# Patient Record
Sex: Male | Born: 1980 | Race: Black or African American | Hispanic: No | Smoking: Current every day smoker
Health system: Southern US, Community
[De-identification: ages and names within clinical notes are randomized; demographics above are authoritative.]

---

## 2005-10-06 ENCOUNTER — Emergency Department (HOSPITAL_COMMUNITY): Admission: EM | Admit: 2005-10-06 | Discharge: 2005-10-07 | Payer: Self-pay | Admitting: Emergency Medicine

## 2008-05-04 ENCOUNTER — Emergency Department (HOSPITAL_COMMUNITY): Admission: EM | Admit: 2008-05-04 | Discharge: 2008-05-04 | Payer: Self-pay | Admitting: Emergency Medicine

## 2009-04-03 ENCOUNTER — Emergency Department (HOSPITAL_COMMUNITY): Admission: EM | Admit: 2009-04-03 | Discharge: 2009-04-03 | Payer: Self-pay | Admitting: Emergency Medicine

## 2009-08-12 ENCOUNTER — Emergency Department (HOSPITAL_COMMUNITY): Admission: EM | Admit: 2009-08-12 | Discharge: 2009-08-12 | Payer: Self-pay | Admitting: Emergency Medicine

## 2010-07-31 LAB — CULTURE, ROUTINE-ABSCESS

## 2010-08-13 LAB — GC/CHLAMYDIA PROBE AMP, GENITAL: GC Probe Amp, Genital: NEGATIVE

## 2012-07-02 ENCOUNTER — Emergency Department (HOSPITAL_COMMUNITY)
Admission: EM | Admit: 2012-07-02 | Discharge: 2012-07-02 | Disposition: A | Discharge status: Home / Self Care | Payer: Self-pay | Attending: Emergency Medicine | Admitting: Emergency Medicine

## 2012-07-02 ENCOUNTER — Encounter (HOSPITAL_COMMUNITY): Payer: Self-pay | Admitting: Emergency Medicine

## 2012-07-02 DIAGNOSIS — L0231 Cutaneous abscess of buttock: Secondary | ICD-10-CM | POA: Insufficient documentation

## 2012-07-02 DIAGNOSIS — F17200 Nicotine dependence, unspecified, uncomplicated: Secondary | ICD-10-CM | POA: Insufficient documentation

## 2012-07-02 LAB — URINALYSIS, ROUTINE W REFLEX MICROSCOPIC
Bilirubin Urine: NEGATIVE
Glucose, UA: NEGATIVE mg/dL
Protein, ur: NEGATIVE mg/dL
pH: 6.5 (ref 5.0–8.0)

## 2012-07-02 LAB — CBC WITH DIFFERENTIAL/PLATELET
Basophils Relative: 0 % (ref 0–1)
Eosinophils Absolute: 0 10*3/uL (ref 0.0–0.7)
Eosinophils Relative: 0 % (ref 0–5)
Lymphs Abs: 1.9 10*3/uL (ref 0.7–4.0)
MCH: 30.8 pg (ref 26.0–34.0)
MCHC: 34.7 g/dL (ref 30.0–36.0)
MCV: 88.9 fL (ref 78.0–100.0)
Monocytes Relative: 7 % (ref 3–12)
Neutro Abs: 9.2 10*3/uL — ABNORMAL HIGH (ref 1.7–7.7)
Platelets: 171 10*3/uL (ref 150–400)
WBC: 11.9 10*3/uL — ABNORMAL HIGH (ref 4.0–10.5)

## 2012-07-02 LAB — COMPREHENSIVE METABOLIC PANEL
AST: 29 U/L (ref 0–37)
Alkaline Phosphatase: 72 U/L (ref 39–117)
Chloride: 102 mEq/L (ref 96–112)
GFR calc Af Amer: >90 mL/min (ref >90)
Glucose, Bld: 75 mg/dL (ref 70–99)
Sodium: 143 mEq/L (ref 135–145)
Total Bilirubin: 0.6 mg/dL (ref 0.3–1.2)

## 2012-07-02 MED ORDER — HYDROMORPHONE HCL PF 1 MG/ML IJ SOLN
1.0000 mg | Freq: Once | INTRAMUSCULAR | Status: AC
  Administered 2012-07-02: 1 mg via INTRAVENOUS
  Filled 2012-07-02: qty 1

## 2012-07-02 MED ORDER — ACETAMINOPHEN 325 MG PO TABS
650.0000 mg | ORAL_TABLET | Freq: Once | ORAL | Status: AC
  Administered 2012-07-02: 650 mg via ORAL
  Filled 2012-07-02: qty 2

## 2012-07-02 MED ORDER — HYDROCODONE-ACETAMINOPHEN 5-325 MG PO TABS: 2.0000 | ORAL_TABLET | ORAL | Status: AC | PRN

## 2012-07-02 MED ORDER — SODIUM CHLORIDE 0.9 % IV SOLN
1000.0000 mL | Freq: Once | INTRAVENOUS | Status: AC
  Administered 2012-07-02: 1000 mL via INTRAVENOUS

## 2012-07-02 MED ORDER — ONDANSETRON HCL 4 MG/2ML IJ SOLN
4.0000 mg | Freq: Once | INTRAMUSCULAR | Status: AC
  Administered 2012-07-02: 4 mg via INTRAVENOUS
  Filled 2012-07-02: qty 2

## 2012-07-02 NOTE — ED Provider Notes (Signed)
History     CSN: [id number]  Arrival date & time 07/02/12  1538   First MD Initiated Contact with Patient 07/02/12 1956      Chief Complaint  Patient presents with  . Rash    (Consider location/radiation/quality/duration/timing/severity/associated sxs/prior treatment) HPI Comments: 3 days ago.  Patient noticed discomfort in his gluteal area.  In the gluteal cleft.  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  At this time.  It is draining her, you wouldn't material is painful to sit.  Denies any fever.  Patient is a 32 y.o. male presenting with rash. The history is provided by the patient.  Rash Location:  Ano-genital Quality: blistering and draining   Severity:  Moderate Duration:  3 days Timing:  Constant Progression:  Worsening Chronicity:  New Associated symptoms: no diarrhea and no fever     History reviewed. No pertinent past medical history.  History reviewed. No pertinent past surgical history.  No family history on file.  History  Substance Use Topics  . Smoking status: Current Every Day Smoker  . Smokeless tobacco: Not on file  . Alcohol Use: Yes      Review of Systems  Constitutional: Negative for fever and chills.  Gastrointestinal: Negative for diarrhea, constipation and rectal pain.  Genitourinary: Negative for dysuria.  Skin: Positive for wound. Negative for rash.  All other systems reviewed and are negative.    Allergies  Review of patient's allergies indicates no known allergies.  Home Medications   Current Outpatient Rx  Name  Route  Sig  Dispense  Refill  . Acetaminophen (TYLENOL PO)   Oral   Take 2 tablets by mouth every 6 (six) hours as needed (pain).         Marland Kitchen HYDROcodone-acetaminophen (NORCO/VICODIN) 5-325 MG per tablet   Oral   Take 2 tablets by mouth every 4 (four) hours as needed for pain.   10 tablet   0     BP 103/63  Pulse 88  Temp(Src) 99.9 F (37.7  C) (Oral)  Resp 20  SpO2 100%  Physical Exam  Nursing note and vitals reviewed. Constitutional: He appears well-developed and well-nourished.  HENT:  Head: Normocephalic.  Eyes: Pupils are equal, round, and reactive to light.  Neck: Normal range of motion.  Cardiovascular: Normal rate.   Abdominal: Soft.  Genitourinary: Rectum normal.     Neurological: He is alert.  Skin: Skin is warm. No erythema.    ED Course  INCISION AND DRAINAGE Date/Time: 07/02/2012 9:18 PM Performed by: Arman Filter Authorized by: Arman Filter Consent: Verbal consent obtained. Risks and benefits: risks, benefits and alternatives were discussed Consent given by: patient Patient understanding: patient states understanding of the procedure being performed Patient identity confirmed: verbally with patient Time out: Immediately prior to procedure a "time out" was called to verify the correct patient, procedure, equipment, support staff and site/side marked as required. Type: abscess Body area: anogenital Anesthesia: local infiltration Local anesthetic: lidocaine 1% with epinephrine Anesthetic total: 2 ml Patient sedated: no Scalpel size: 11 Needle gauge: 22 Incision type: single straight Complexity: simple Drainage: purulent Drainage amount: copious Packing material: 1/4 in iodoform gauze Patient tolerance: Patient tolerated the procedure well with no immediate complications.   (including critical care time)  Labs Reviewed  CBC WITH DIFFERENTIAL - Abnormal; Notable for the following:    WBC 11.9 (*)    Neutro Abs 9.2 (*)  All other components within normal limits  COMPREHENSIVE METABOLIC PANEL  URINALYSIS, ROUTINE W REFLEX MICROSCOPIC   No results found.   1. Abscess of buttock, right       MDM   Patient has a draining abscess in the right gluteal fold approximately 1 cm x 1 cm, with multiple punctate openings.  No surrounding erythema or cellulitis.  I&D was performed for  copious amount of pertinent fluid 1/2 inch packing placed to allow continuous drainage.  Over the next 2 days.  Patient was instructed to remove this in 2 days.  He may shower, but not bathe, he'll be sent home with pain medication, and instructions for        Arman Filter, NP 07/02/12 2121

## 2012-07-02 NOTE — ED Provider Notes (Signed)
Medical screening examination/treatment/procedure(s) were performed by non-physician practitioner and as supervising physician I was immediately available for consultation/collaboration.   Loren Racer, MD 07/02/12 2248

## 2012-07-02 NOTE — ED Notes (Signed)
Pt c/o painful rash above rectum, onset 3 days ago.  Pt st's his glands are also swollen.

## 2012-07-02 NOTE — ED Notes (Addendum)
Verified temp of 100.0 orally with Kathrine Cords, NP; Dondra Spry, NP states pt stable for d/c, but can put in for 650 mg of tylenol before d/c.

## 2012-07-02 NOTE — ED Notes (Addendum)
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 States that he thinks the other hole has become infected. Yellow exudate present when pulling buttocks apart. Pt states pain is 7/10 with movement. Denies nausea, vomiting with the pain. R/O pilonidal cyst.

## 2012-07-02 NOTE — ED Notes (Signed)
Pt ambulatory leaving ED; pt alert and mentating appropriately upon d/c. Pt given d/c teaching and prescriptions. Pt has been instructed not to drive and endorses that he is not driving and has a friend coming to pick him up from ED; pt has no further questions upon d/c. Pt does not show signs of acute distress upon d/c.

## 2012-07-02 NOTE — ED Notes (Signed)
Gave pt mesh underwear to wear per EDP instruction.

## 2012-07-02 NOTE — ED Notes (Signed)
Pt attempting to call for ride home

## 2014-04-25 ENCOUNTER — Emergency Department (HOSPITAL_COMMUNITY)
Admission: EM | Admit: 2014-04-25 | Discharge: 2014-04-26 | Disposition: A | Discharge status: Home / Self Care | Payer: Self-pay | Attending: Emergency Medicine | Admitting: Emergency Medicine

## 2014-04-25 ENCOUNTER — Emergency Department (HOSPITAL_COMMUNITY): Payer: Self-pay

## 2014-04-25 ENCOUNTER — Encounter (HOSPITAL_COMMUNITY): Payer: Self-pay | Admitting: Emergency Medicine

## 2014-04-25 DIAGNOSIS — L03012 Cellulitis of left finger: Secondary | ICD-10-CM | POA: Insufficient documentation

## 2014-04-25 DIAGNOSIS — Z720 Tobacco use: Secondary | ICD-10-CM | POA: Insufficient documentation

## 2014-04-25 NOTE — ED Notes (Signed)
The patient said he woke up and his pinky finger on his left hand is red and swollen.   He denies injury.  His finger is red, swollen and he rates his pain 6/10.

## 2014-04-25 NOTE — ED Provider Notes (Signed)
CSN: [id number]     Arrival date & time 04/25/14  2219 History  This chart was scribed for Dorthula Matasiffany G Yaretsi Humphres, PA-C, working with Donnetta HutchingBrian Cook, MD by Elon SpannerGarrett Cook, ED Scribe. This patient was seen in room TR10C/TR10C and the patient's care was started at 11:46 PM.   Chief Complaint  Patient presents with  . Finger Injury    The patient said he woke up and his pinky finger on his left hand is red and swollen.   The history is provided by the patient. No language interpreter was used.   HPI Comments: Oscar LitterGino Desir is a 33 y.o. male who presents to the Emergency Department complaining of pain with associated swelling on his left hand ring finger onset this morning.  Patient reports touch aggravates the pain.  Patient does not recall injury or puncture. He can move his finger without significant pain but it hurts to touch.  NKA. No fevers,  History reviewed. No pertinent past medical history. History reviewed. No pertinent past surgical history. History reviewed. No pertinent family history. History  Substance Use Topics  . Smoking status: Current Every Day Smoker  . Smokeless tobacco: Not on file  . Alcohol Use: Yes    Review of Systems  Musculoskeletal: Positive for joint swelling.  All other systems reviewed and are negative.   Allergies  Review of patient's allergies indicates no known allergies.  Home Medications   Prior to Admission medications   Medication Sig Start Date End Date Taking? Authorizing Provider  Acetaminophen (TYLENOL PO) Take 2 tablets by mouth every 6 (six) hours as needed (pain).   Yes Historical Provider, MD  cephALEXin (KEFLEX) 500 MG capsule Take 1 capsule (500 mg total) by mouth 4 (four) times daily. 04/26/14   Destan Franchini Irine SealG Abigaile Rossie, PA-C  HYDROcodone-acetaminophen (NORCO/VICODIN) 5-325 MG per tablet Take 2 tablets by mouth every 4 (four) hours as needed for pain. Patient not taking: Reported on 04/25/2014 07/02/12   Arman FilterGail K Schulz, NP  traMADol (ULTRAM) 50 MG tablet  Take 1 tablet (50 mg total) by mouth every 6 (six) hours as needed. 04/26/14   Lashina Milles Irine SealG Zane Samson, PA-C   BP 114/73 mmHg  Pulse 75  Temp(Src) 97.5 F (36.4 C) (Oral)  Resp 20  SpO2 100% Physical Exam  Constitutional: He is oriented to person, place, and time. He appears well-developed and well-nourished. No distress.  HENT:  Head: Normocephalic and atraumatic.  Eyes: Conjunctivae and EOM are normal.  Neck: Neck supple. No tracheal deviation present.  Cardiovascular: Normal rate.   Pulmonary/Chest: Effort normal. No respiratory distress.  Musculoskeletal: Normal range of motion.       Hands: The skin is warm and moist. There is no wound or lesions. No ecchymosis, crepitus. CR < 2 seconds. No significant swelling at this time or sausage digit  Neurological: He is alert and oriented to person, place, and time.  Skin: Skin is warm and dry.  Psychiatric: He has a normal mood and affect. His behavior is normal.  Nursing note and vitals reviewed.   ED Course  Procedures (including critical care time)  DIAGNOSTIC STUDIES: Oxygen Saturation is 100% on RA, normal by my interpretation.    COORDINATION OF CARE:  11:48 PM Informed patient of infection.  Patients finger shows cellulitis but no paroncyhia, falon, or tenosynovitis at this time. He is at risk for either of this but it is too early to tell for sure. Hopefully the abx will prevent either of these. Will prescribe pain medication, antibiotics  and provide first does in ED.  Advised patient to return to ED for follow-up in 24-48 hours.  Advised patient of return precautions including sever pain, increased swelling, red streaking.  Patient acknowledges and agrees with plan.    Labs Review Labs Reviewed - No data to display  Imaging Review Dg Finger Little Left  04/26/2014   CLINICAL DATA:  Finger pain  EXAM: LEFT LITTLE FINGER 2+V  COMPARISON:  None.  FINDINGS: No fracture or dislocation is seen.  The joint spaces are preserved.  The  visualized soft tissues are unremarkable.  IMPRESSION: No fracture or dislocation is seen.   Electronically Signed   By: Charline BillsSriyesh  Krishnan M.D.   On: 04/26/2014 00:06     EKG Interpretation None      MDM   Final diagnoses:  Cellulitis of finger of left hand    33 y.o.Oscar Reynolds's evaluation in the Emergency Department is complete. It has been determined that no acute conditions requiring further emergency intervention are present at this time. The patient/guardian have been advised of the diagnosis and plan. We have discussed signs and symptoms that warrant return to the ED, such as changes or worsening in symptoms.  Vital signs are stable at discharge. Filed Vitals:   04/26/14 0017  BP: 114/73  Pulse: 75  Temp: 97.5 F (36.4 C)  Resp: 20    Patient/guardian has voiced understanding and agreed to follow-up with the PCP or specialist.  I personally performed the services described in this documentation, which was scribed in my presence. The recorded information has been reviewed and is accurate.    Dorthula Matasiffany G Mayleen Borrero, PA-C 04/26/14 [id number]  Gwyneth SproutWhitney Plunkett, MD 04/27/14 [phone]

## 2014-04-26 ENCOUNTER — Encounter (HOSPITAL_COMMUNITY): Payer: Self-pay | Admitting: Emergency Medicine

## 2014-04-26 ENCOUNTER — Emergency Department (HOSPITAL_COMMUNITY)
Admission: EM | Admit: 2014-04-26 | Discharge: 2014-04-26 | Disposition: A | Discharge status: Home / Self Care | Payer: Self-pay | Attending: Emergency Medicine | Admitting: Emergency Medicine

## 2014-04-26 DIAGNOSIS — L03012 Cellulitis of left finger: Secondary | ICD-10-CM | POA: Insufficient documentation

## 2014-04-26 DIAGNOSIS — Z720 Tobacco use: Secondary | ICD-10-CM | POA: Insufficient documentation

## 2014-04-26 MED ORDER — CEPHALEXIN 500 MG PO CAPS: 500.0000 mg | ORAL_CAPSULE | Freq: Four times a day (QID) | ORAL | Status: AC

## 2014-04-26 MED ORDER — CEPHALEXIN 250 MG PO CAPS
500.0000 mg | ORAL_CAPSULE | Freq: Once | ORAL | Status: AC
  Administered 2014-04-26: 500 mg via ORAL
  Filled 2014-04-26: qty 2

## 2014-04-26 MED ORDER — TRAMADOL HCL 50 MG PO TABS: 50.0000 mg | ORAL_TABLET | Freq: Four times a day (QID) | ORAL | Status: AC | PRN

## 2014-04-26 MED ORDER — TRAMADOL HCL 50 MG PO TABS
50.0000 mg | ORAL_TABLET | Freq: Once | ORAL | Status: AC
  Administered 2014-04-26: 50 mg via ORAL
  Filled 2014-04-26: qty 1

## 2014-04-26 NOTE — ED Notes (Signed)
Patient here for wound check of left 5th finger. States that he was seen here for the same yesterday and was asked to return for recheck today. Patient started ABX as prescribed.

## 2014-04-26 NOTE — ED Provider Notes (Signed)
CSN: [id number]     Arrival date & time 04/26/14  [id number] History  This chart was scribed for non-physician practitioner, Trixie DredgeEmily Azzure Garabedian, PA-C, working with Loren Raceravid Yelverton, MD, by Bronson CurbJacqueline Melvin, ED Scribe. This patient was seen in room TR04C/TR04C and the patient's care was started at 8:25 PM.   Chief Complaint  Patient presents with  . Wound Check    The history is provided by the patient. No language interpreter was used.     HPI Comments: Oscar LitterGino Reynolds is a 33 y.o. male, with no significant medical history, who presents to the Emergency Department for a wound check. Patient states he was seen yesterday and diagnosed with cellulitis to his left 5th finger.  He was prescribed Tramadol and Kelfex and informed to return today for a follow up. There is associated mild tenderness to the left 5th finger and patient states his finger feels "tight". However, he states his symptoms have improved and he is now able to move the finger. He reports he has been taking ibuprofen with relief. He denies fever, chills, nausea, vomiting, diarrhea. Patient is right hand dominant and is not established with a PCP.  History reviewed. No pertinent past medical history. History reviewed. No pertinent past surgical history. History reviewed. No pertinent family history. History  Substance Use Topics  . Smoking status: Current Every Day Smoker -- 0.25 packs/day    Types: Cigarettes  . Smokeless tobacco: Not on file  . Alcohol Use: Yes    Review of Systems  Constitutional: Negative for fever and chills.  Gastrointestinal: Negative for nausea, vomiting and abdominal pain.  Musculoskeletal: Positive for myalgias.  Skin: Positive for color change.  Allergic/Immunologic: Negative for immunocompromised state.  Neurological: Negative for weakness and numbness.  Psychiatric/Behavioral: Negative for self-injury.      Allergies  Review of patient's allergies indicates no known allergies.  Home Medications    Prior to Admission medications   Medication Sig Start Date End Date Taking? Authorizing Provider  Acetaminophen (TYLENOL PO) Take 2 tablets by mouth every 6 (six) hours as needed (pain).    Historical Provider, MD  cephALEXin (KEFLEX) 500 MG capsule Take 1 capsule (500 mg total) by mouth 4 (four) times daily. 04/26/14   Tiffany Irine SealG Greene, PA-C  HYDROcodone-acetaminophen (NORCO/VICODIN) 5-325 MG per tablet Take 2 tablets by mouth every 4 (four) hours as needed for pain. Patient not taking: Reported on 04/25/2014 07/02/12   Arman FilterGail K Schulz, NP  traMADol (ULTRAM) 50 MG tablet Take 1 tablet (50 mg total) by mouth every 6 (six) hours as needed. 04/26/14   Dorthula Matasiffany G Greene, PA-C   Triage Vitals: BP 119/65 mmHg  Pulse 86  Temp(Src) 98.5 F (36.9 C) (Oral)  Resp 18  Wt 135 lb (61.236 kg)  SpO2 96%  Physical Exam  Constitutional: He appears well-developed and well-nourished. No distress.  HENT:  Head: Normocephalic and atraumatic.  Neck: Neck supple.  Pulmonary/Chest: Effort normal.  Musculoskeletal:  Left 5th finger with erythema and warmth over dorsal aspect. Sensation intact. No drainage, induration, or fluctuance. Full active ROM of left 5th finger. Capillary refill <2. No streaking proximal to finger.  Neurological: He is alert.  Skin: Skin is warm. He is not diaphoretic. There is erythema.  Nursing note and vitals reviewed.   ED Course  Procedures (including critical care time)  DIAGNOSTIC STUDIES: Oxygen Saturation is 96% on room air, adequate by my interpretation.    COORDINATION OF CARE: At 2030 Discussed treatment plan with patient. Patient agrees.  Labs Review Labs Reviewed - No data to display  Imaging Review Dg Finger Little Left  04/26/2014   CLINICAL DATA:  Finger pain  EXAM: LEFT LITTLE FINGER 2+V  COMPARISON:  None.  FINDINGS: No fracture or dislocation is seen.  The joint spaces are preserved.  The visualized soft tissues are unremarkable.  IMPRESSION: No fracture  or dislocation is seen.   Electronically Signed   By: Charline BillsSriyesh  Krishnan M.D.   On: 04/26/2014 00:06     EKG Interpretation None      MDM   Final diagnoses:  Cellulitis of finger of left hand    Afebrile, nontoxic patient with cellulitis of left 5th finger, dorsally.  Seen yesterday in ED returns today for advised recheck.  Pt reports great improvement.  No e/o abscess on exam.  Doubt deep space infection.  Pt strongly advised to continue antibiotics until gone.  Pt declines pain medications.  Discussed return precautions.   D/C home.   Discussed result, findings, treatment, and follow up  with patient.  Pt given return precautions.  Pt verbalizes understanding and agrees with plan.       I personally performed the services described in this documentation, which was scribed in my presence. The recorded information has been reviewed and is accurate.   Trixie Dredgemily Johnavon Mcclafferty, PA-C 04/26/14 2103  Loren Raceravid Yelverton, MD 04/26/14 [phone]

## 2014-04-26 NOTE — Discharge Instructions (Signed)

## 2014-04-26 NOTE — Discharge Instructions (Signed)
Read the information below.  You may return to the Emergency Department at any time for worsening condition or any new symptoms that concern you.  Continue taking the antibiotics prescribed to you until they are gone.  If you develop increased redness, swelling, pus draining from the wound, or fevers greater than 100.4, return to the ER immediately for a recheck.     Cellulitis Cellulitis is an infection of the skin and the tissue beneath it. The infected area is usually red and tender. Cellulitis occurs most often in the arms and lower legs.  CAUSES  Cellulitis is caused by bacteria that enter the skin through cracks or cuts in the skin. The most common types of bacteria that cause cellulitis are staphylococci and streptococci. SIGNS AND SYMPTOMS   Redness and warmth.  Swelling.  Tenderness or pain.  Fever. DIAGNOSIS  Your health care provider can usually determine what is wrong based on a physical exam. Blood tests may also be done. TREATMENT  Treatment usually involves taking an antibiotic medicine. HOME CARE INSTRUCTIONS   Take your antibiotic medicine as directed by your health care provider. Finish the antibiotic even if you start to feel better.  Keep the infected arm or leg elevated to reduce swelling.  Apply a warm cloth to the affected area up to 4 times per day to relieve pain.  Take medicines only as directed by your health care provider.  Keep all follow-up visits as directed by your health care provider. SEEK MEDICAL CARE IF:   You notice red streaks coming from the infected area.  Your red area gets larger or turns dark in color.  Your bone or joint underneath the infected area becomes painful after the skin has healed.  Your infection returns in the same area or another area.  You notice a swollen bump in the infected area.  You develop new symptoms.  You have a fever. SEEK IMMEDIATE MEDICAL CARE IF:   You feel very sleepy.  You develop vomiting or  diarrhea.  You have a general ill feeling (malaise) with muscle aches and pains. MAKE SURE YOU:   Understand these instructions.  Will watch your condition.  Will get help right away if you are not doing well or get worse. Document Released: 01/23/2005 Document Revised: 08/30/2013 Document Reviewed: 07/01/2011 [facility] Patient Information 2015 [place] [employer] [place]. This information is not intended to replace advice given to you by your health care provider. Make sure you discuss any questions you have with your health care provider.    Emergency Department Resource Guide 1) Find a Doctor and Pay Out of Pocket Although you won't have to find out who is covered by your insurance plan, it is a good idea to ask around and get recommendations. You will then need to call the office and see if the doctor you have chosen will accept you as a new patient and what types of options they offer for patients who are self-pay. Some doctors offer discounts or will set up payment plans for their patients who do not have insurance, but you will need to ask so you aren't surprised when you get to your appointment.  2) Contact Your Local Health Department Not all health departments have doctors that can see patients for sick visits, but many do, so it is worth a call to see if yours does. If you don't know where your local health department is, you can check in your phone book. The CDC also has a tool to help  you locate your state's health department, and many state websites also have listings of all of their local health departments.  3) Find a Walk-in Clinic If your illness is not likely to be very severe or complicated, you may want to try a walk in clinic. These are popping up all over the country in pharmacies, drugstores, and shopping centers. They're usually staffed by nurse practitioners or physician assistants that have been trained to treat common illnesses and complaints. They're usually fairly quick and  inexpensive. However, if you have serious medical issues or chronic medical problems, these are probably not your best option.  No Primary Care Doctor: - Call Health Connect at  [phone] - they can help you locate a primary care doctor that  accepts your insurance, provides certain services, etc. - Physician Referral Service- [phone]  Chronic Pain Problems: Organization         Address  Phone   Notes  [facility]  [phone] Patients need to be referred by their primary care doctor.   Medication Assistance: Organization         Address  Phone   Notes  [facility] [address] [place], [place] [id number] [phone] --Must be a resident of [facility] -- Must have NO insurance coverage whatsoever (no Medicaid/ Medicare, etc.) -- The pt. MUST have a primary care doctor that directs their care regularly and follows them in the community   [facility]ist  [phone]   [employer]  [phone]) [phone]    Agencies that provide inexpensive medical care: Organization         Address  Phone   Notes  [facility]  [phone]   [facility]    [phone]) [phone]   [facility] [address] [place], [place] [address] [phone]   [facility] [address], [place] [phone]   [facility]    [phone]   [facility]    [phone]   [facility]  [address], [place] Phone:  [phone], Fax:  [phone] Hours of Operation:  9 am - 6 pm, M-F.  Also accepts Medicaid/Medicare and self-pay.  [facility]  [address], [place] Phone: [phone]) [phone], Fax: [phone]. Hours of Operation:  8:30 am - 5:30 pm, M-F.  Also accepts Medicaid and self-pay.  [facility] [place] [address], [place] Phone: [phone]) [phone]   [facility] [address], [place], [place] [phone], Ext. [phone] Mondays & Thursdays: 7-9 AM.  First 15 patients are seen on a first come, first serve basis.    Medicaid-accepting [facility] Providers:  Organization         Address  Phone   Notes  [facility] [address], [place] [phone] Also accepts self-pay patients.  [facility] [address] [place], [place]  [phone]   [facility] [address], [place] [phone]   [facility] [address] [place] [address], [place] [phone]   Renaye RakersVeita Bland [address], [place]   [phone] Only accepts [place] Access [place] patients after they have their name applied to their card.   Self-Pay (no insurance) in [facility] [place]:  Organization  Address  Phone   Notes  Sickle Cell Patients, [facility] [place] [phone]   [facility] [place] [phone]   [facility]  [place], [address], [place] [phone]   [facility]/Dr. Osei-Bonsu  [address] [place] or [address], [place] [phone] Phone number for both [place] and [place] locations is the same.  [facility] [address] [place] [phone]   [facility] [address], [place] or [address] [phone]   [facility] [address], [place] [phone], phone; [phone], fax Sees patients 1st and 3rd Saturday of every month.  Must not qualify for public or private insurance (i.e. Medicaid, Medicare, [place] Health Choice, Veterans' Benefits)  Household income should be no more than 200% of the poverty level The clinic cannot treat you if you are pregnant or  think you are pregnant  Sexually transmitted diseases are not treated at the clinic.    Dental Care: Organization         Address  Phone  Notes  [facility] [place] [phone] Accepts children up to age 3 who are enrolled in [place] or [place]; pregnant women with a Medicaid card; and children who have applied for Medicaid or [place] Health Choice, but were declined, whose parents can pay a reduced fee at time of service.  [facility]  [address], [place] [phone] Accepts children up to age 81 who are enrolled in [place] or [place]; pregnant women with a Medicaid card; and children who have applied for Medicaid or [place] Health Choice, but were declined, whose parents can pay a reduced fee at time of service.  [facility]  [place] [phone] Patients are seen by appointment only. Walk-ins are not accepted. [place] will see patients 82 years of age and older. Monday - Tuesday (8am-5pm) Most Wednesdays (8:30-5pm) $30 per visit, cash only  [facility]  [address], [facility] [phone] Patients are seen by appointment only. Walk-ins are not accepted. [place] will see patients 17 years of age and older. One Wednesday Evening (Monthly: Volunteer Based).  $30 per visit, cash only  [place]  [phone] for adults; Children under age 57, call Graduate Pediatric Dentistry at [phone]. Children aged 44-14, please call [phone] to request a pediatric application.  Dental services are provided in all areas of dental care including fillings, crowns and bridges, complete and partial dentures, implants, gum treatment, root canals, and extractions. Preventive care is also provided. Treatment is provided to both adults  and children. Patients are selected via a lottery and there is often a waiting list.   [facility] [address] [place]  [phone] [email or web address]   [facility] [address] [place], [place] [phone], Ext. [phone] Second and Fourth Thursday of each month, opens at 6:30 AM; Clinic ends at 9 AM.  Patients are seen on a first-come first-served basis, and a limited number are seen during each clinic.   [facility]  [address] [place], [place], [place] [phone]   Eligibility Requirements °You must have lived in [place], [place], or [place] counties for at least the last three months. °  You cannot be eligible for state or federal sponsored healthcare insurance, including Veterans Administration, Medicaid, or Medicare. °  You generally cannot be eligible for healthcare insurance through your employer.  °  How to apply: °Eligibility screenings are held every Tuesday and Wednesday afternoon from 1:00 pm until 4:00 pm. You do not need an appointment for the interview!  °[facility] [address], [place], [place] [phone]   °[facility]  [phone]   °[facility]  [phone]   °[facility]  336-570-6415   ° °Behavioral Health Resources in the Community: °Intensive Outpatient Programs °Organization         Address  Phone  Notes  °[facility] [place] [facility] [address], [place], [place] [phone]   °[facility] [address], [place], [place] [phone]   °[facility] [address], [place], [place] ° 336-882-2125   °[facility] [address],  °[place], [place] 1-800-853-5163 or 336-641-4981   °Substance Abuse Resources °Organization         Address  Phone  Notes  °[facility]  336-882-2125   °[facility]  336-784-9470   °[facility]  336-285-9073     °[facility]  336-845-3988   °Residential & Outpatient Substance Abuse Program  1-800-659-3381   °Psychological Services °Organization         Address  Phone  Notes  °[facility]  336- [phone]   °[facility]  336- 378-7881   °[facility] [address], [place] 1-800-853-5163 or 336-641-4981   ° °Mobile Crisis Teams °Organization         Address  Phone  Notes  °[facility], Mobile Crisis Care Unit  1-877-626-1772   °Assertive °Psychotherapeutic Services ° [address] [place], [place] 336-834-9664   °Sharon DeEsch [address] °[place] 336-554-5454   ° °Self-Help/Support Groups °Organization         Address  Phone             Notes  °[facility] [place] - variety of support groups  336- 373-1402 Call for more information  °Narcotics Anonymous (NA), [facility] [address], °[place]  2 meetings at this location  ° °Residential Treatment Programs °Organization         Address  Phone  Notes  °[facility] [address],    °[place]  1-866-801-8205   °[facility] ° [address], [place], [place] [phone]   °[facility] [address], [place] 336-845-3988 Admissions: 8am-3pm M-F  °[facility] [address]    °[place], [place] 336-841-1104   °[facility] [address], [place], [place] 336-379-7146   °[facility] [address]  °[place], [place] 336-285-9073   °[facility] - Intensive Outpatient [address], [place], [place] 336-852-3033   °[facility] ([facility] [address]  °[place], [place] 1-877-615-2722 or 336-784-9470   °[facility] ([facility]) [address] [place], [place] 336-227-7417 Accepts Medicaid  °[facility] [address]  °[place] 1-800-659-3381 Substance Abuse/Addiction Treatment  ° °[place]  Behavioral Health Resources °  Organization         Address  Phone  Notes  °[facility]  (888) 581-9988   °Julie Brannon, PhD [address] [place], [place]   (336) 349-5553 or (336) 951-0000   °[facility]   [address] °[place], [place] (336) 349-4454   °[facility] [address], [place], [place] (336) 342-8316 Insurance/Medicaid/sponsorship through [facility]  °[facility] [address]                                    [place], [place] (336) 342-8316 Therapy/tele-psych/case  °[facility] [address]  ° [place], [place] (336) 349-2233    °Dr. Arfeen  (336) 349-4544   °[facility] [place]  [employer] [place] [facility] 1) [address],  °2) [address], [place] °3)  [address] [place] [address], [place] (336) 349-3220 °(336) 342-7768 ° °(336) 342-8140   °[place] Child Abuse Hotline (336) 342-1394 or (336) 342-3537 (After Hours)    ° ° ° °

## 2014-04-26 NOTE — ED Notes (Signed)
Patient states that finger is doing better. Appears reddened and swollen in triage.

## 2014-06-23 ENCOUNTER — Emergency Department (HOSPITAL_COMMUNITY)
Admission: EM | Admit: 2014-06-23 | Discharge: 2014-06-23 | Disposition: A | Discharge status: Home / Self Care | Payer: Self-pay | Attending: Emergency Medicine | Admitting: Emergency Medicine

## 2014-06-23 ENCOUNTER — Encounter (HOSPITAL_COMMUNITY): Payer: Self-pay

## 2014-06-23 DIAGNOSIS — Z792 Long term (current) use of antibiotics: Secondary | ICD-10-CM | POA: Insufficient documentation

## 2014-06-23 DIAGNOSIS — J069 Acute upper respiratory infection, unspecified: Secondary | ICD-10-CM | POA: Insufficient documentation

## 2014-06-23 DIAGNOSIS — Z720 Tobacco use: Secondary | ICD-10-CM | POA: Insufficient documentation

## 2014-06-23 LAB — RAPID STREP SCREEN (MED CTR MEBANE ONLY): Streptococcus, Group A Screen (Direct): NEGATIVE

## 2014-06-23 MED ORDER — PHENOL 1.4 % MT LIQD: 1.0000 | OROMUCOSAL | Status: AC | PRN

## 2014-06-23 NOTE — ED Provider Notes (Signed)
CSN: [id number]     Arrival date & time 06/23/14  1111 History  This chart was scribed for Sharilyn SitesLisa Sanders, PA-C working with Donnetta HutchingBrian Cook, MD by Elveria Risingimelie Horne, ED Scribe. This patient was seen in room TR06C/TR06C and the patient's care was started at 11:24 AM.   Chief Complaint  Patient presents with  . Facial Pain  . Sore Throat   The history is provided by the patient. No language interpreter was used.   HPI Comments: Oscar LitterGino Reynolds is a 34 y.o. male who presents to the Emergency Department complaining of cold symptoms that initiated as postnasal drainage, onset three days ago. Patient reports that the initial post nasal drainage irritated his stomach and caused him nausea. He denies vomiting or abdominal pain.  Yesterday patient reports that the drainage caused itchy throat and burning pain with coughing and sneezing. Patient reports associated subjective fever, cough, fatigue and myalgias.  He denies known sick contacts.  No chest pain or SOB.  VSS on arrival.  History reviewed. No pertinent past medical history. History reviewed. No pertinent past surgical history. History reviewed. No pertinent family history. History  Substance Use Topics  . Smoking status: Current Every Day Smoker -- 0.25 packs/day    Types: Cigarettes  . Smokeless tobacco: Not on file  . Alcohol Use: Yes    Review of Systems  Constitutional: Positive for chills. Negative for fever.  HENT: Positive for congestion, postnasal drip, sneezing and sore throat. Negative for trouble swallowing and voice change.   Respiratory: Positive for cough.   Cardiovascular: Negative for chest pain.  Musculoskeletal: Positive for myalgias.  All other systems reviewed and are negative.   Allergies  Review of patient's allergies indicates no known allergies.  Home Medications   Prior to Admission medications   Medication Sig Start Date End Date Taking? Authorizing Provider  Acetaminophen (TYLENOL PO) Take 2 tablets by mouth every  6 (six) hours as needed (pain).    Historical Provider, MD  cephALEXin (KEFLEX) 500 MG capsule Take 1 capsule (500 mg total) by mouth 4 (four) times daily. 04/26/14   Tiffany Irine SealG Greene, PA-C  HYDROcodone-acetaminophen (NORCO/VICODIN) 5-325 MG per tablet Take 2 tablets by mouth every 4 (four) hours as needed for pain. Patient not taking: Reported on 04/26/2014 07/02/12   Arman FilterGail K Schulz, NP  ibuprofen (ADVIL,MOTRIN) 200 MG tablet Take 200 mg by mouth every 6 (six) hours as needed for moderate pain.    Historical Provider, MD  traMADol (ULTRAM) 50 MG tablet Take 1 tablet (50 mg total) by mouth every 6 (six) hours as needed. Patient not taking: Reported on 04/26/2014 04/26/14   Dorthula Matasiffany G Greene, PA-C   Triage Vitals: BP 115/73 mmHg  Pulse 87  Temp(Src) 98.7 F (37.1 C) (Oral)  Resp 20  SpO2 100% Physical Exam  Constitutional: He is oriented to person, place, and time. He appears well-developed and well-nourished. No distress.  HENT:  Head: Normocephalic and atraumatic.  Right Ear: Tympanic membrane and ear canal normal.  Left Ear: Tympanic membrane and ear canal normal.  Nose: Mucosal edema present.  Mouth/Throat: Uvula is midline and mucous membranes are normal. Posterior oropharyngeal erythema present. No oropharyngeal exudate, posterior oropharyngeal edema or tonsillar abscesses.  Nasal congestion with PND noted; Tonsils normal in appearance bilaterally without exudate; uvula midline without peritonsillar abscess; handling secretions appropriately; no difficulty swallowing or speaking  Eyes: Conjunctivae and EOM are normal. Pupils are equal, round, and reactive to light.  Neck: Normal range of motion. Neck supple.  No tracheal deviation present.  Cardiovascular: Normal rate, regular rhythm and normal heart sounds.   Pulmonary/Chest: Effort normal and breath sounds normal. No respiratory distress. He has no wheezes.  Abdominal: Soft. Bowel sounds are normal.  Musculoskeletal: Normal range of  motion.  Neurological: He is alert and oriented to person, place, and time.  Skin: Skin is warm and dry.  Psychiatric: He has a normal mood and affect. His behavior is normal.  Nursing note and vitals reviewed.   ED Course  Procedures (including critical care time)  COORDINATION OF CARE: 11:31 AM- Discussed treatment plan with patient at bedside and patient agreed to plan.   Labs Review Labs Reviewed  RAPID STREP SCREEN  CULTURE, GROUP A STREP    Imaging Review No results found.   EKG Interpretation None      MDM   Final diagnoses:  URI (upper respiratory infection)   34 y.o. M with URI symptoms for the past 3-4 days.  No known sick contacts. Patient afebrile and non-toxic in appearance.  Lungs CTAB.  Rapid strep negative, culture pending.  Suspect viral process.  VS remain stable in ED.  Patient will be d/c home with supportive care.  Discussed plan with patient, he/she acknowledged understanding and agreed with plan of care.  Return precautions given for new or worsening symptoms.  I personally performed the services described in this documentation, which was scribed in my presence. The recorded information has been reviewed and is accurate.  Garlon Hatchet, PA-C 06/23/14 1441  Donnetta Hutching, MD 06/23/14 [phone]

## 2014-06-23 NOTE — Discharge Instructions (Signed)
Your rapid strep test was negative. Take the prescribed medication as directed. Return to the ED for new or worsening symptoms.

## 2014-06-23 NOTE — ED Notes (Signed)
Pt from home with sinus congestion and sore throat x 3-4 days.  Pt reports chills, subjective fever and body aches.  Pt has been taking theraflu with no relief.

## 2014-06-25 LAB — CULTURE, GROUP A STREP: STREP A CULTURE: NEGATIVE

## 2015-05-09 IMAGING — DX DG FINGER LITTLE 2+V*L*
3 series · 3 of 3 positions shown · non-contrast
Comparison: None.

CLINICAL DATA: Finger pain

EXAM:
LEFT LITTLE FINGER 2+V

[finger ap]
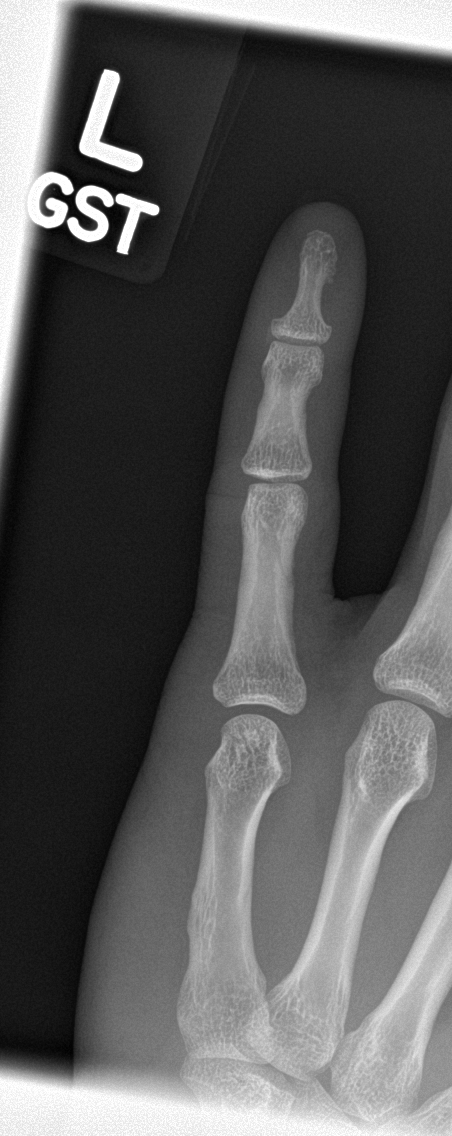

[finger obl]
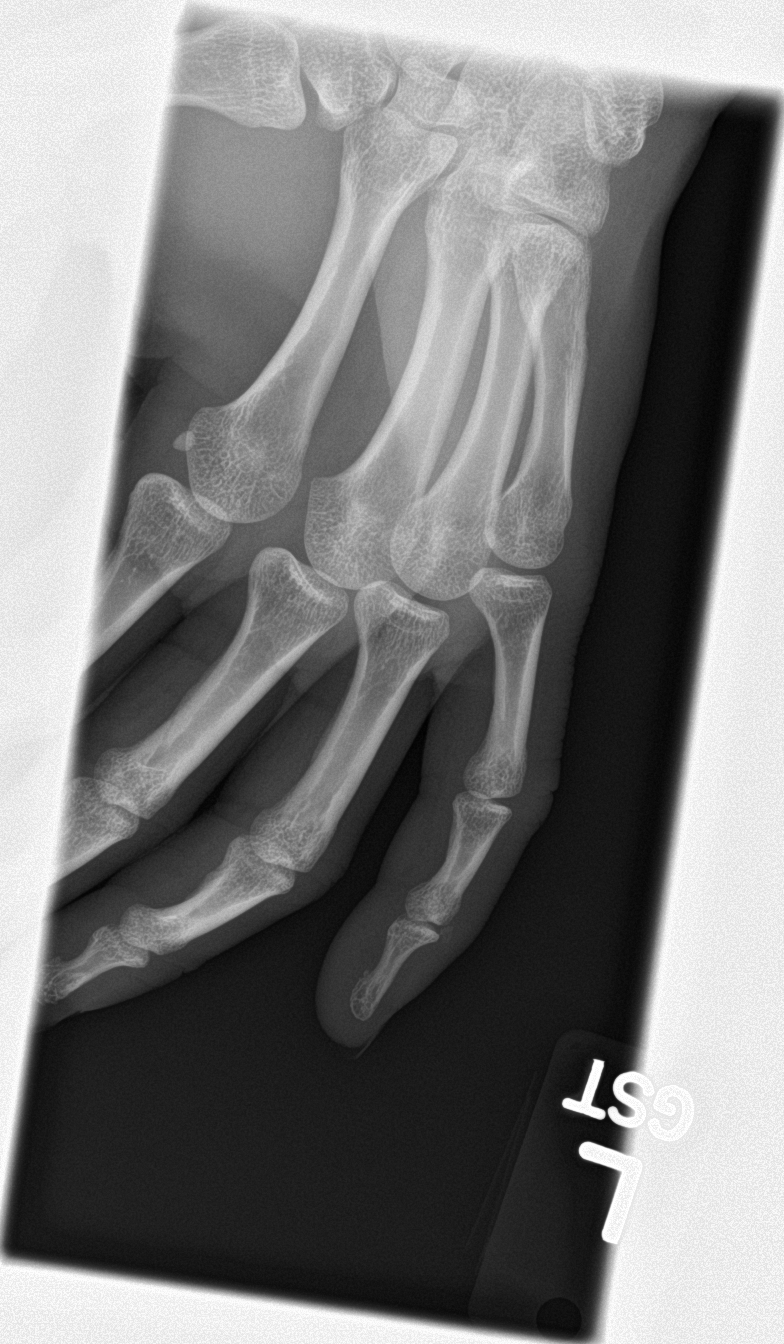

[finger lat]
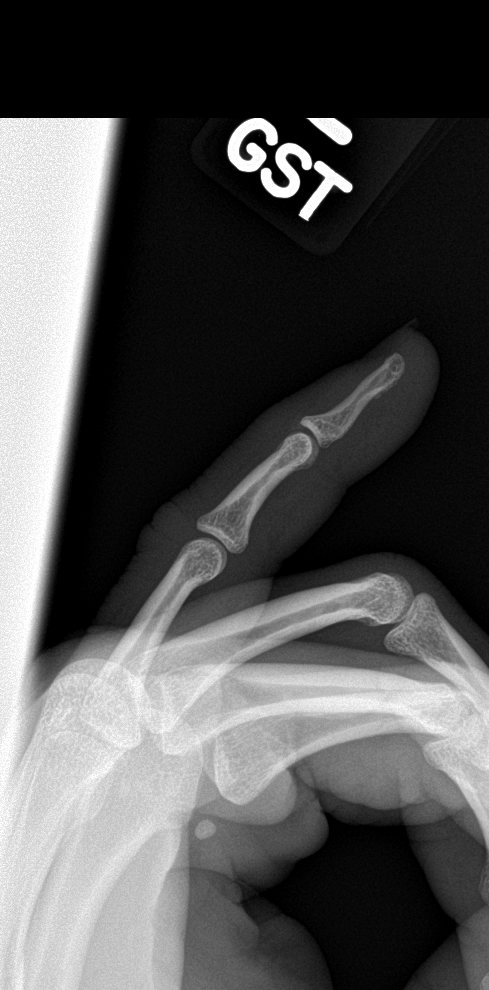

[3 of 3 positions shown; findings below may reference images not displayed]

FINDINGS: No fracture or dislocation is seen.

The joint spaces are preserved.

The visualized soft tissues are unremarkable.
IMPRESSION: No fracture or dislocation is seen.

## 2024-04-07 ENCOUNTER — Emergency Department (HOSPITAL_COMMUNITY)

## 2024-04-07 ENCOUNTER — Encounter (HOSPITAL_COMMUNITY): Payer: Self-pay

## 2024-04-07 ENCOUNTER — Emergency Department (HOSPITAL_COMMUNITY): Admission: EM | Admit: 2024-04-07 | Discharge: 2024-04-07 | Disposition: A | Discharge status: Home / Self Care

## 2024-04-07 ENCOUNTER — Other Ambulatory Visit: Payer: Self-pay

## 2024-04-07 DIAGNOSIS — X503XXA Overexertion from repetitive movements, initial encounter: Secondary | ICD-10-CM | POA: Insufficient documentation

## 2024-04-07 DIAGNOSIS — S299XXA Unspecified injury of thorax, initial encounter: Secondary | ICD-10-CM | POA: Diagnosis present

## 2024-04-07 DIAGNOSIS — S29019A Strain of muscle and tendon of unspecified wall of thorax, initial encounter: Secondary | ICD-10-CM

## 2024-04-07 DIAGNOSIS — S29012A Strain of muscle and tendon of back wall of thorax, initial encounter: Secondary | ICD-10-CM | POA: Insufficient documentation

## 2024-04-07 DIAGNOSIS — Y990 Civilian activity done for income or pay: Secondary | ICD-10-CM | POA: Diagnosis not present

## 2024-04-07 MED ORDER — METHOCARBAMOL 750 MG PO TABS: 750.0000 mg | ORAL_TABLET | Freq: Three times a day (TID) | ORAL | 0 refills | Status: AC | PRN

## 2024-04-07 MED ORDER — METHOCARBAMOL 500 MG PO TABS
1000.0000 mg | ORAL_TABLET | Freq: Once | ORAL | Status: AC
  Administered 2024-04-07: 1000 mg via ORAL
  Filled 2024-04-07: qty 2

## 2024-04-07 MED ORDER — LIDOCAINE 5 % EX PTCH: 1.0000 | MEDICATED_PATCH | CUTANEOUS | 0 refills | Status: AC

## 2024-04-07 MED ORDER — NAPROXEN 500 MG PO TABS: 500.0000 mg | ORAL_TABLET | Freq: Two times a day (BID) | ORAL | 0 refills | Status: AC

## 2024-04-07 MED ORDER — KETOROLAC TROMETHAMINE 15 MG/ML IJ SOLN
15.0000 mg | Freq: Once | INTRAMUSCULAR | Status: AC
  Administered 2024-04-07: 15 mg via INTRAMUSCULAR
  Filled 2024-04-07: qty 1

## 2024-04-07 NOTE — Discharge Instructions (Addendum)
 Your x-ray did not show any concerning findings.  I think you would likely have a pinched nerve in your back.  Please take the medications as prescribed.  Use the lidocaine patches and naproxen twice daily.  Take the muscle relaxer as needed.  Please return to the emergency department with fevers, bowel or bladder incontinence, weakness, or other concerning symptoms.  If you do not have a primary care provider please call the referral line at the number provided to establish with want to follow-up with.

## 2024-04-07 NOTE — ED Provider Notes (Signed)
 [place] EMERGENCY DEPARTMENT AT [facility] Provider Note   CSN: [id number] Arrival date & time: 04/07/24  1223     Patient presents with: Back Pain   Oscar Reynolds is a 43 y.o. male.   43 year old male with no reported past medical history presenting to the emergency department today with thoracic back pain as well as some pain with deep breathing.  The patient states that he was doing some heavy lifting at work yesterday and did note some mid thoracic back pain.  Is mostly on the right side.  He states it does radiate around to his right chest.  States that he went home was feeling better last night.  Woke up this morning and when he got to work he states that the pain came back.  He states the pain is worse with movement.  He denies any bowel or bladder dysfunction or saddle anesthesia.  The pain is mostly on the right side.  He denies any history of IV drug abuse.   Back Pain      Prior to Admission medications   Medication Sig Start Date End Date Taking? Authorizing Provider  lidocaine (LIDODERM) 5 % Place 1 patch onto the skin daily. Remove & Discard patch within 12 hours or as directed by MD 04/07/24  Yes Ula Prentice SAUNDERS, MD  methocarbamol  (ROBAXIN ) 750 MG tablet Take 1 tablet (750 mg total) by mouth every 8 (eight) hours as needed for muscle spasms. 04/07/24  Yes Ula Prentice SAUNDERS, MD  naproxen (NAPROSYN) 500 MG tablet Take 1 tablet (500 mg total) by mouth 2 (two) times daily. 04/07/24  Yes Ula Prentice SAUNDERS, MD  Acetaminophen  (TYLENOL  PO) Take 2 tablets by mouth every 6 (six) hours as needed (pain).    [provider]  cephALEXin  (KEFLEX ) 500 MG capsule Take 1 capsule (500 mg total) by mouth 4 (four) times daily. 04/26/14   Levora Riggs, PA-C  HYDROcodone -acetaminophen  (NORCO/VICODIN) 5-325 MG per tablet Take 2 tablets by mouth every 4 (four) hours as needed for pain. Patient not taking: Reported on 04/26/2014 07/02/12   Terryl Kubas, NP  ibuprofen (ADVIL,MOTRIN)  200 MG tablet Take 200 mg by mouth every 6 (six) hours as needed for moderate pain.    [provider]  phenol (CHLORASEPTIC) 1.4 % LIQD Use as directed 1 spray in the mouth or throat as needed for throat irritation / pain. 06/23/14   Jarold Olam HERO, PA-C  traMADol  (ULTRAM ) 50 MG tablet Take 1 tablet (50 mg total) by mouth every 6 (six) hours as needed. Patient not taking: Reported on 04/26/2014 04/26/14   Levora Riggs, PA-C    Allergies: Patient has no known allergies.    Review of Systems  Musculoskeletal:  Positive for back pain.  All other systems reviewed and are negative.   Updated Vital Signs BP 101/78 (BP Location: Left Arm)   Pulse 70   Temp 98.5 F (36.9 C) (Oral)   Resp 18   Ht 5' 4 (1.626 m)   Wt 61.2 kg   SpO2 100%   BMI 23.17 kg/m   Physical Exam Vitals and nursing note reviewed.   Gen: NAD Eyes: PERRL, EOMI HEENT: no oropharyngeal swelling Neck: trachea midline Resp: clear to auscultation bilaterally Card: RRR, no murmurs, rubs, or gallops Extremities: no calf tenderness, no edema MSK: The patient is tender over the right paraspinal region with no significant tenderness over the midline of the thoracic spine Vascular: 2+ radial pulses bilaterally, 2+ DP pulses bilaterally  Skin: no rashes Psyc: acting appropriately   (all labs ordered are listed, but only abnormal results are displayed) Labs Reviewed - No data to display  EKG: None  Radiology: DG Chest 1 View Result Date: 04/07/2024 CLINICAL DATA:  Chest pain and shortness of breath. Evaluate for pneumothorax. EXAM: CHEST  1 VIEW COMPARISON:  08/12/2009 FINDINGS: Cardiomediastinal silhouette and pulmonary vasculature are within normal limits. Lungs are clear. IMPRESSION: No acute cardiopulmonary process. Electronically Signed   By: Aliene Lloyd M.D.   On: 04/07/2024 14:16     Procedures   Medications Ordered in the ED  ketorolac  (TORADOL ) 15 MG/ML injection 15 mg (15 mg Intramuscular  Given 04/07/24 1320)  methocarbamol  (ROBAXIN ) tablet 1,000 mg (1,000 mg Oral Given 04/07/24 1319)                                    Medical Decision Making 43 year old male with no reported past medical history presenting to the emergency department today with thoracic back pain.  The patient is stating he is having some pain with inspiration.  He is PERC negative and based on description of his symptoms and reproducibility suspicion for pulmonary embolism is low at this time.  Will obtain x-ray to evaluate for spontaneous pneumothorax.  Given his age and lack of any recent injuries we will hold off on x-ray of his back at this time.  I will reevaluate for ultimate disposition.  Also doubt that this is due to aortic pathology at this time.  The patient's x-ray is negative.  He was given Toradol  and Robaxin  for pain.  Will be discharged with NSAIDs and muscle relaxers with return precautions.  Amount and/or Complexity of Data Reviewed Radiology: ordered.  Risk Prescription drug management.        Final diagnoses:  Thoracic myofascial strain, initial encounter    ED Discharge Orders          Ordered    naproxen (NAPROSYN) 500 MG tablet  2 times daily        04/07/24 1430    lidocaine (LIDODERM) 5 %  Every 24 hours        04/07/24 1430    methocarbamol  (ROBAXIN ) 750 MG tablet  Every 8 hours PRN        04/07/24 1430               Ula Prentice SAUNDERS, MD 04/07/24 1431

## 2024-04-07 NOTE — ED Provider Triage Note (Signed)
 Emergency Medicine Provider Triage Evaluation Note  Oscar Reynolds , a 43 y.o. male  was evaluated in triage.  Pt complains of right-sided back and pain with movement/deep breathing.  The patient states that he was doing some heavy lifting at work yesterday.  Woke up this morning and was having pain mostly on the right side of his back.  Does radiate around and the patient does state that it hurts to take a deep breath.  He denies any bowel or bladder dysfunction or saddle anesthesia.  Denies any fevers or IV drug abuse.  Review of Systems  Positive: See above Negative: Leg swelling  Physical Exam  BP 101/78 (BP Location: Left Arm)   Pulse 70   Temp 98.5 F (36.9 C) (Oral)   Resp 18   Ht 5' 4 (1.626 m)   Wt 61.2 kg   SpO2 100%   BMI 23.17 kg/m  Gen:   Awake, no distress   Resp:  Normal effort  MSK:   Moves extremities without difficulty  Other:  The patient is tender over the right paraspinal region in the thoracic region, equal strength sensation throughout bilateral lower extremities  Medical Decision Making  Medically screening exam initiated at 1:11 PM.  Appropriate orders placed.  Meghan Rains was informed that the remainder of the evaluation will be completed by another provider, this initial triage assessment does not replace that evaluation, and the importance of remaining in the ED until their evaluation is complete.  Will obtain x-ray to evaluate for pneumothorax and give the patient Toradol  and muscle relaxers.  Given age and no trauma will hold off on thoracic x-ray at this time.  This does seem consistent with thoracic radiculopathy.  No red flag symptoms for cauda equina syndrome or cord compressing lesion at this time.   Ula Prentice SAUNDERS, MD 04/07/24 1314

## 2024-04-07 NOTE — ED Triage Notes (Signed)
 Patient bib EMS from work with complaints of worsening back pain. He has been having right sided thoracis pain for the past few days. Pain is now 8/10 and radiates to his chest when he breathes. He has been lifting boxes recently.
